# Patient Record
Sex: Female | Born: 1935 | Race: White | Hispanic: No | Smoking: Never smoker
Health system: Southern US, Community
[De-identification: ages and names within clinical notes are randomized; demographics above are authoritative.]

## PROBLEM LIST (undated history)

## (undated) DIAGNOSIS — C801 Malignant (primary) neoplasm, unspecified: Secondary | ICD-10-CM

---

## 2004-01-10 ENCOUNTER — Other Ambulatory Visit: Payer: Self-pay

## 2004-01-11 ENCOUNTER — Other Ambulatory Visit: Payer: Self-pay

## 2004-01-11 ENCOUNTER — Inpatient Hospital Stay: Payer: Self-pay | Admitting: Internal Medicine

## 2004-08-05 ENCOUNTER — Ambulatory Visit: Payer: Self-pay | Admitting: Internal Medicine

## 2005-06-15 ENCOUNTER — Ambulatory Visit: Payer: Self-pay | Admitting: Internal Medicine

## 2005-08-11 ENCOUNTER — Ambulatory Visit: Payer: Self-pay | Admitting: Internal Medicine

## 2005-10-10 ENCOUNTER — Emergency Department: Payer: Self-pay | Admitting: Emergency Medicine

## 2006-08-15 ENCOUNTER — Ambulatory Visit: Payer: Self-pay | Admitting: Internal Medicine

## 2007-02-20 ENCOUNTER — Ambulatory Visit: Payer: Self-pay | Admitting: Gastroenterology

## 2007-03-13 ENCOUNTER — Ambulatory Visit (HOSPITAL_COMMUNITY): Admission: RE | Admit: 2007-03-13 | Discharge: 2007-03-13 | Payer: Self-pay | Admitting: Gastroenterology

## 2007-03-13 ENCOUNTER — Ambulatory Visit: Payer: Self-pay | Admitting: Gastroenterology

## 2007-03-16 ENCOUNTER — Ambulatory Visit (HOSPITAL_COMMUNITY): Admission: RE | Admit: 2007-03-16 | Discharge: 2007-03-16 | Payer: Self-pay | Admitting: Gastroenterology

## 2007-08-17 ENCOUNTER — Ambulatory Visit: Payer: Self-pay | Admitting: Internal Medicine

## 2008-01-29 ENCOUNTER — Ambulatory Visit: Payer: Self-pay | Admitting: Internal Medicine

## 2008-08-19 ENCOUNTER — Ambulatory Visit: Payer: Self-pay | Admitting: Internal Medicine

## 2009-08-20 ENCOUNTER — Ambulatory Visit: Payer: Self-pay | Admitting: Internal Medicine

## 2009-09-30 ENCOUNTER — Emergency Department: Payer: Self-pay | Admitting: Otolaryngology

## 2010-07-14 NOTE — H&P (Signed)
Tammy Alvarado, Tammy Alvarado                ACCOUNT NO.:  [id number]   MEDICAL RECORD NO.:  [id number]          PATIENT TYPE:  AMB   LOCATION:  DAY                           FACILITY:  [facility]   PHYSICIAN:  Kassie Mends, M.D.      DATE OF BIRTH:  10/23/1931   DATE OF ADMISSION:  DATE OF DISCHARGE:  LH                              HISTORY & PHYSICAL   PRIMARY CARE PHYSICIAN:  Dr. Bethann Punches.   CHIEF COMPLAINT:  Chest pain/not in chest.   HISTORY OF PRESENT ILLNESS:  Tammy Alvarado is a 77 year old female. She has  had several episodes over many years of feeling as though she has a  significant pressure/pain in her chest with swallowing.  She had an  episode about 3 weeks ago where she was awakened in the middle of the  night with leg cramps. She got up to drink some tonic water, while  drinking the water she felt a severe knot and chest pressure.  She  grabbed onto the kitchen cabinet and felt like she was going to pass  out.  She became quite diaphoretic. She had significant weakness. She  said that the sensation passed after 5 minutes or so but she did not  feel herself she never sought medical care after this episode until  today.  She denies any heartburn or indigestion.  She has not had a  physical exam through her primary care physician, Dr. Bethann Punches, this  year. She occasionally complains of dysphagia when drinking liquids or  eating foods. She has had some burning-type pain to her epigastrium and  retrosternally with swallowing water as well.  The pain at night was  8/10 on a pain scale. She denies any rectal bleeding.  Denies any  melena.  Denies any diarrhea or constipation.  Denies any nausea or  vomiting.   PAST MEDICAL AND SURGICAL HISTORY:  Migraine headaches. She had a  colonoscopy 12 years ago which was normal per her report. She has had  cataract surgery in the left eye and right knee surgery.   CURRENT MEDICATIONS:  1. B12 250 mcg daily.  2. Biotin 1 mg daily.  3.  PreserVision once daily.  4. Calcium 600 mg of vitamin D daily.  5. Tylenol p.r.n.   ALLERGIES:  TETANUS shot and bee stings.   FAMILY HISTORY:  There is no known family history of colorectal  carcinoma or liver problems.  One brother did die of esophageal cancer  at age 24.  Mother deceased at age 35 secondary to old age.  Father  deceased at 64 unknown etiology.  Three sisters, one with leukemia, one  with breast cancer, diabetes mellitus,two brothers, one with  hypertension.   SOCIAL HISTORY:  Tammy Alvarado is a widow.  She lives alone.  She is retired.  She has three healthy sons.  She denies any tobacco, alcohol or drug  use.  She is a retired Insurance account manager.   REVIEW OF SYSTEMS:  See HPI.  CARDIOVASCULAR:  She denies any chest pain  currently and has not had any since the last  episode 3 weeks ago.  PULMONARY:  She denies any chronic cough.  She does have some shortness  of breath with the episodes of pain but then resolves. She denies any  dyspnea on exertion, denies any hemoptysis.  GI: See HPI. GU: Denies any  dysuria, hematuria, or increased urinary frequency. CONSTITUTIONAL:  Weight has been stable, denies any fever or chills.   PHYSICAL EXAM:  VITAL SIGNS:  Weight 182 pounds, height 64 inches,  temperature 97.5, blood pressure 158/92, pulse 60.  GENERAL:  She is an elderly Caucasian female who is alert, oriented,  pleasant, cooperative in no acute distress.  HEENT:  Sclera clear,  nonicteric,  conjunctiva clear, oropharynx pink  and moist without any lesions.  NECK:  Supple without mass or thyromegaly.  CHEST:  Heart regular rate and rhythm.  Normal S1 and S2 without  murmurs, clicks, rubs or gallops.  LUNGS:  Clear to auscultation bilaterally.  ABDOMEN:  Positive bowel sounds x4.  No bruits auscultated.  Soft,  nontender, nondistended.  No palpable mass or hepatosplenomegaly.  No  rebound tenderness or guarding.  EXTREMITIES:  Without clubbing or edema bilaterally.   SKIN:  Pink, warm and dry without any rash or jaundice.   IMPRESSION:  Tammy Alvarado is a 77 year old, self-referred patient with  episodic atypical chest pain which is mostly retrosternal initiated with  swallowing water usually.  Interestingly with episode she is having  presyncope and severe weakness and diaphoresis.  The pain can be quite  severe at times.  I am not 100% convinced that this is due to  gastroesophageal reflux disease or structural abnormality of the  esophagus, for example web ring or stricture.  At minimum, she is going  to need complete physical exam by Dr. Hyacinth Meeker to rule out cardiopulmonary  etiology given her other symptoms initially.  Her symptoms are quite  infrequent and intermittent.  She is also noted to be hypertensive today  with a blood pressure of 158/92.   PLAN:  1. Would like her to be seen by Dr. Bethann Punches, her primary care      physician, as soon as possible for further evaluation to rule out      cardiopulmonary causes of chest pain. Once she is cleared from him,      will proceed with EGD to further evaluate her esophagus and she is      due for screening colonoscopy which can be scheduled at the same      time with Dr. Cira Servant. I have discussed both procedures including      risks and benefits which include but are not limited to bleeding,      infection, perforation, and drug reaction and she agrees to plan      and consent will be obtained.  2. Further recommendations pending procedures.      Lorenza Burton, N.P.      Kassie Mends, M.D.  Electronically Signed    KJ/MEDQ  D:  02/20/2007  T:  02/21/2007  Job:  [id number]   cc:   Bethann Punches  Fax: [phone]

## 2010-07-14 NOTE — Op Note (Signed)
Tammy Alvarado, Tammy Alvarado                ACCOUNT NO.:  [id number]   MEDICAL RECORD NO.:  [id number]          PATIENT TYPE:  AMB   LOCATION:  DAY                           FACILITY:  [facility]   PHYSICIAN:  Kassie Mends, M.D.      DATE OF BIRTH:  1931-08-09   DATE OF PROCEDURE:  03/13/2007  DATE OF DISCHARGE:                               OPERATIVE REPORT   PROCEDURE:  1. Esophagogastroduodenoscopy with Savary dilation.  2. Incomplete colonoscopy.   FINDINGS:  1. Sigmoid colon diverticulosis.  Otherwise no polyps, masses,      inflammatory changes or arteriovenous malformations seen.  The      colonoscopy could not be complete because when the scope looped in      her sigmoid colon and cause distention, the patient's heart rate      decreased from 70 to 37.  The cecum could not be intubated without      causing cecal distention and the procedure was terminated.  2. Normal retroflexed view of the rectum.  3. Distal esophageal web dilated with a Savary dilator to 15 mm.      Otherwise no masses, erosions, or ulcerations seen in the      esophagus.  4. Normal stomach, duodenal bulb and second portion of the duodenum.   DIAGNOSES:  1. Dysphagia likely secondary to distal esophageal web.  2. Incomplete colonoscopy due to vagal response.   RECOMMENDATIONS:  1. Ms. Ohalloran should have a double contrast barium enema to complete      evaluation of her colon.  I do not recommend that she have a      colonoscopy because of her vagal response to scope looping in      insertion.  2. Follow-up appointment with Dr. Cira Servant in six  weeks to reevaluate      her dysphagia.  3. No aspirin, NSAIDs or anticoagulation for seven days.  4. She should follow high fiber diet.  She is given handout on high-      fiber diet, diverticulosis.   MEDICATIONS:  1. Demerol 75 mg IV.  2. Versed 6 mg IV.   PROCEDURE TECHNIQUE:  Physical exam was performed.  Informed consent was  obtained from the patient after explaining  benefits, risks and  alternatives to the procedure.  The patient was connected to monitor and  placed in left lateral position.  Continuous oxygen was provided by  nasal cannula and IV medicine administered through an indwelling  cannula.  After administration of sedation and rectal exam, the  patient's rectum intubated.  The scope was advanced under direct  visualization to the proximal transverse colon.  The scope was removed  slowly by carefully examining the color, texture, anatomy and integrity  of mucosa on the way out.   After the colonoscopy, the patient's esophagus was intubated with a  diagnostic gastroscope.  The scope was advanced under direct  visualization to the second portion of the duodenum.  The scope was  removed slowly by carefully examining the color, texture, anatomy and  integrity of the mucosa on the way out.  The scope was then advanced  from the proximal esophagus into the antrum.  The Savary guidewire was  introduced.  The scope was removed.  The Savary dilators were introduced  successively from 12.8 mm to 15 mm.  The patient was recovered in  endoscopy.  Due to her somnolence and borderline heart rate 0.4 mg a  Romazicon was administered IV.  The patient was discharged home in  satisfactory condition.      Kassie Mends, M.D.  Electronically Signed     SM/MEDQ  D:  03/13/2007  T:  03/13/2007  Job:  [id number]   cc:   Bethann Punches  Fax: [phone]

## 2010-10-15 ENCOUNTER — Ambulatory Visit: Payer: Self-pay | Admitting: Internal Medicine

## 2010-10-22 ENCOUNTER — Ambulatory Visit: Payer: Self-pay | Admitting: Physician Assistant

## 2010-10-23 ENCOUNTER — Other Ambulatory Visit: Payer: Self-pay | Admitting: Physician Assistant

## 2011-10-26 ENCOUNTER — Ambulatory Visit: Payer: Self-pay | Admitting: Internal Medicine

## 2012-05-01 ENCOUNTER — Observation Stay: Payer: Self-pay | Admitting: Internal Medicine

## 2012-05-01 LAB — CK TOTAL AND CKMB (NOT AT ARMC)
CK, Total: 55 U/L (ref 21–215)
CK-MB: 1.7 ng/mL (ref 0.5–3.6)

## 2012-05-01 LAB — COMPREHENSIVE METABOLIC PANEL
Albumin: 4.3 g/dL (ref 3.4–5.0)
Alkaline Phosphatase: 105 U/L (ref 50–136)
Calcium, Total: 9.7 mg/dL (ref 8.5–10.1)
Co2: 28 mmol/L (ref 21–32)
EGFR (African American): >60
EGFR (Non-African Amer.): 54 — ABNORMAL LOW
Glucose: 117 mg/dL — ABNORMAL HIGH (ref 65–99)
Osmolality: 286 (ref 275–301)
Potassium: 3.8 mmol/L (ref 3.5–5.1)
SGOT(AST): 18 U/L (ref 15–37)
Sodium: 140 mmol/L (ref 136–145)

## 2012-05-01 LAB — CBC
MCH: 30.8 pg (ref 26.0–34.0)
RBC: 5.11 10*6/uL (ref 3.80–5.20)
RDW: 13.7 % (ref 11.5–14.5)

## 2012-05-01 LAB — TROPONIN I: Troponin-I: <0.02 ng/mL

## 2012-05-02 DIAGNOSIS — R079 Chest pain, unspecified: Secondary | ICD-10-CM

## 2012-05-02 LAB — CK TOTAL AND CKMB (NOT AT ARMC): CK, Total: 51 U/L (ref 21–215)

## 2012-05-16 ENCOUNTER — Ambulatory Visit: Payer: Self-pay | Admitting: Internal Medicine

## 2012-05-24 ENCOUNTER — Ambulatory Visit: Payer: Self-pay | Admitting: Gastroenterology

## 2012-05-25 LAB — PATHOLOGY REPORT

## 2012-12-25 ENCOUNTER — Ambulatory Visit: Payer: Self-pay | Admitting: Internal Medicine

## 2012-12-28 ENCOUNTER — Emergency Department: Payer: Self-pay | Admitting: Emergency Medicine

## 2012-12-28 LAB — CBC
HCT: 42.6 % (ref 35.0–47.0)
MCH: 31.2 pg (ref 26.0–34.0)
MCHC: 34.7 g/dL (ref 32.0–36.0)
Platelet: 185 10*3/uL (ref 150–440)
RBC: 4.74 10*6/uL (ref 3.80–5.20)
RDW: 13.7 % (ref 11.5–14.5)
WBC: 8.6 10*3/uL (ref 3.6–11.0)

## 2012-12-28 LAB — CK TOTAL AND CKMB (NOT AT ARMC)
CK, Total: 158 U/L (ref 21–215)
CK-MB: 3.3 ng/mL (ref 0.5–3.6)

## 2012-12-28 LAB — TROPONIN I: Troponin-I: <0.02 ng/mL

## 2012-12-28 LAB — BASIC METABOLIC PANEL
Anion Gap: 6 — ABNORMAL LOW (ref 7–16)
Co2: 26 mmol/L (ref 21–32)
Osmolality: 287 (ref 275–301)

## 2012-12-28 LAB — PROTIME-INR
INR: 1
Prothrombin Time: 12.9 secs (ref 11.5–14.7)

## 2013-03-26 ENCOUNTER — Ambulatory Visit: Payer: Self-pay | Admitting: Internal Medicine

## 2013-07-02 ENCOUNTER — Ambulatory Visit: Payer: Self-pay | Admitting: Ophthalmology

## 2013-07-11 ENCOUNTER — Ambulatory Visit: Payer: Self-pay | Admitting: Ophthalmology

## 2013-11-14 ENCOUNTER — Emergency Department: Payer: Self-pay | Admitting: Emergency Medicine

## 2013-11-14 LAB — COMPREHENSIVE METABOLIC PANEL
ALK PHOS: 89 U/L
ALT: 16 U/L
AST: 29 U/L (ref 15–37)
Albumin: 3.8 g/dL (ref 3.4–5.0)
Anion Gap: 5 — ABNORMAL LOW (ref 7–16)
BUN: 22 mg/dL — ABNORMAL HIGH (ref 7–18)
Bilirubin,Total: 0.5 mg/dL (ref 0.2–1.0)
CALCIUM: 8.7 mg/dL (ref 8.5–10.1)
CHLORIDE: 109 mmol/L — AB (ref 98–107)
Co2: 27 mmol/L (ref 21–32)
Creatinine: 0.63 mg/dL (ref 0.60–1.30)
EGFR (African American): >60
EGFR (Non-African Amer.): >60
Glucose: 86 mg/dL (ref 65–99)
OSMOLALITY: 284 (ref 275–301)
POTASSIUM: 4 mmol/L (ref 3.5–5.1)
SODIUM: 141 mmol/L (ref 136–145)
Total Protein: 7 g/dL (ref 6.4–8.2)

## 2013-11-14 LAB — CBC
HCT: 45.9 % (ref 35.0–47.0)
HGB: 15.1 g/dL (ref 12.0–16.0)
MCH: 30.5 pg (ref 26.0–34.0)
MCHC: 33 g/dL (ref 32.0–36.0)
MCV: 93 fL (ref 80–100)
PLATELETS: 213 10*3/uL (ref 150–440)
RBC: 4.96 10*6/uL (ref 3.80–5.20)
RDW: 13.7 % (ref 11.5–14.5)
WBC: 7.1 10*3/uL (ref 3.6–11.0)

## 2013-11-14 LAB — URINALYSIS, COMPLETE
BILIRUBIN, UR: NEGATIVE
BLOOD: NEGATIVE
Glucose,UR: NEGATIVE mg/dL (ref 0–75)
Hyaline Cast: 3
KETONE: NEGATIVE
LEUKOCYTE ESTERASE: NEGATIVE
NITRITE: POSITIVE
PH: 5 (ref 4.5–8.0)
PROTEIN: NEGATIVE
RBC,UR: 2 /HPF (ref 0–5)
Specific Gravity: 1.025 (ref 1.003–1.030)
Squamous Epithelial: 1
WBC UR: 7 /HPF (ref 0–5)

## 2013-11-14 LAB — TROPONIN I: Troponin-I: <0.02 ng/mL

## 2013-11-16 LAB — URINE CULTURE

## 2013-12-03 DIAGNOSIS — M5030 Other cervical disc degeneration, unspecified cervical region: Secondary | ICD-10-CM | POA: Insufficient documentation

## 2013-12-26 ENCOUNTER — Ambulatory Visit: Payer: Self-pay | Admitting: Internal Medicine

## 2014-05-16 DIAGNOSIS — K625 Hemorrhage of anus and rectum: Secondary | ICD-10-CM | POA: Diagnosis not present

## 2014-05-16 DIAGNOSIS — M1990 Unspecified osteoarthritis, unspecified site: Secondary | ICD-10-CM | POA: Diagnosis not present

## 2014-05-16 DIAGNOSIS — Z79899 Other long term (current) drug therapy: Secondary | ICD-10-CM | POA: Diagnosis not present

## 2014-05-16 DIAGNOSIS — K219 Gastro-esophageal reflux disease without esophagitis: Secondary | ICD-10-CM | POA: Diagnosis not present

## 2014-06-21 NOTE — Discharge Summary (Signed)
PATIENT NAME:  Tammy Alvarado, Tammy Alvarado MR#:  [id number] DATE OF BIRTH:  [birth year]  DATE OF ADMISSION:  05/01/2012 DATE OF DISCHARGE:  05/02/2012  DISCHARGE DIAGNOSES: 1.  Hypotension with atypical chest pain.  2.  Viral gastroenteritis.  3.  Acid reflux.   DISCHARGE MEDICATIONS:  Ranitidine 150 mg b.i.d., amlodipine 5 mg at bedtime.   REASON FOR ADMISSION:  This is a 81 year old female who presents with hypotension, diarrhea and chest pain. Please see history and physical for history of present illness, past medical history, and physical exam.   HOSPITAL COURSE: The patient was admitted, ruled out for myocardial infarction. EKG normal.  Stress Myoview was normal.  Her diarrhea was improved. Her blood pressure medicine of lisinopril was discontinued and Norvasc added at bedtime. She will follow up with Dr. Sabra Heck on the following day to follow up on her bowel symptoms.   Overall prognosis: Good     ____________________________ Rusty Aus, MD mfm:ct D: 05/03/2012 17:46:23 ET T: 05/03/2012 19:22:49 ET JOB#: [id number]  cc: Rusty Aus, MD, <Dictator> MARK Roselee Culver MD ELECTRONICALLY SIGNED 05/05/2012 10:53

## 2014-06-21 NOTE — H&P (Signed)
PATIENT NAME:  Tammy Alvarado, Tammy Alvarado MR#:  [id number] DATE OF BIRTH:  1931/10/31  DATE OF ADMISSION:  05/01/2012  PRIMARY CARE PHYSICIAN: Dr. Emily Filbert.   CHIEF COMPLAINT: Chest pain.   HISTORY OF PRESENT ILLNESS: A 81 year old female patient with history of hypertension and a negative stress test in 2005, presents to the Emergency Room with acute onset of midsternal heaviness. This was associated with nausea with nonradiating 8/10 presently down to 3/10 with no response to nitroglycerin pills. The patient has not had any shortness of breath or palpitations. She had some chest burning in 2005, for which she had a stress test, but this pain is different from that.   The patient has been recently started on 2, blood pressure pills by Dr. Sabra Heck, but she does not remember their names.   PAST MEDICAL HISTORY: Hypertension, GERD.   PAST SURGICAL HISTORY: Knee surgery, nose skin cancer surgery.   SOCIAL HISTORY: The patient does not smoke. No alcohol. No illicit drugs. 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   FAMILY HISTORY: Father had an enlarged heart.   HOME MEDICATIONS: Unknown, but the patient mentions that she is on 2 blood pressure medications.   ALLERGIES: CEFTIN, TETANUS.   REVIEW OF SYSTEMS:  CONSTITUTIONAL: No fatigue, weakness.  EYES: No blurred vision, pain, redness.   ENT: No tinnitus, ear pain, hearing loss.  RESPIRATORY: No cough, wheeze, hemoptysis.  CARDIOVASCULAR: Complains of midsternal heaviness. No arrhythmia.  GASTROINTESTINAL: Had nausea, but no vomiting. Had diarrhea week prior for a day, which has resolved.  GENITOURINARY: No dysuria, hematuria, frequency.  ENDOCRINE: No polyuria, nocturia, thyroid problems.  HEMATOLOGIC/LYMPHATIC: No anemia, easy bruising, bleeding.  INTEGUMENTARY: No acne, rash, lesions.  MUSCULOSKELETAL: No back pain or arthritis.  NEUROLOGIC: No focal numbness, weakness, seizures.  PSYCHIATRIC:  No anxiety,  depression.   PHYSICAL EXAMINATION: VITAL SIGNS: Temperature 98.3, pulse 87, blood pressure 168/92, saturating 96% on room air.  GENERAL: Obese, Caucasian female patient lying in bed, comfortable, conversational, cooperative with exam.  PSYCHIATRIC:  Alert and oriented x 3. Mood and affect appropriate. Judgment intact.  HEENT: Atraumatic, normocephalic. Oral mucosa moist and pink. External ears and nose normal. No pallor. No icterus. Pupils bilaterally equal and reactive to light.  NECK: Supple. No thyromegaly. No palpable lymph nodes. Trachea midline. No carotid bruit, JVD.  CARDIOVASCULAR: S1, S2 without any murmurs, regular rate and rhythm. No edema.  RESPIRATORY: Normal work of breathing. Clear to auscultation on both sides. No chest wall tenderness.  GASTROINTESTINAL: Soft abdomen, nontender. Bowel sounds present. No hepatosplenomegaly palpable.  SKIN: Warm and dry. No petechiae, rash, ulcers.  MUSCULOSKELETAL: No joint swelling, redness, effusion of the large joints. Normal muscle tone.  NEUROLOGICAL: Motor strength 5/5 in upper and lower extremities. Sensation to fine touch intact all over.  LYMPHATIC: No cervical lymphadenopathy.   LABORATORY, DIAGNOSTIC AND RADIOLOGIC:  Show glucose 117, BUN 28, creatinine 1. Troponin less than 0.02. AST, ALT, alkaline phosphatase, bilirubin normal. INR of 1.   EKG shows normal sinus rhythm with no acute ST-T wave changes.   Chest x-ray PA and lateral shows no acute cardiopulmonary disease.   ASSESSMENT AND PLAN: 1.  Atypical chest pain in a 81 year old patient with hypertension. The patient did have acute onset of chest pain, which she describes as heaviness associated with nausea, but has not had any shortness of breath, palpitations, or lightheadedness or radiation to the left arm, presently atypical. We will check 2 more sets of cardiac enzymes.  If normal, the patient will have a Lexiscan stress test in the morning. If abnormal, the patient will  need cardiology consult and possible catheterization.  We will start the patient on aspirin, nitroglycerin p.r.n. The patient will be on a telemetry floor.  2.  Hypertension, uncontrolled. This could be secondary to the chest pain and anxiety. She does not remember her home medications. We will start her on hydrochlorothiazide, use IV pain medications to control the blood pressure,  needs to restart home medications once available.  3.  Gastroesophageal reflux disease. The patient's chest pain could be a variant of her acid reflux. We will start her on Protonix while in the hospital.  4.  Deep vein thrombosis prophylaxis with Lovenox.   CODE STATUS: Full code.   TIME SPENT: Today on this case was 40 minutes.     ____________________________ Leia Alf Sudini, MD srs:cc D: 05/01/2012 17:13:11 ET T: 05/01/2012 18:42:08 ET JOB#: [id number]  cc: Alveta Heimlich R. Darvin Neighbours, MD, <Dictator> Rusty Aus, MD Neita Carp MD ELECTRONICALLY SIGNED 05/02/2012 14:03

## 2014-06-22 NOTE — Op Note (Signed)
PATIENT NAME:  Tammy Alvarado, Tammy Alvarado MR#:  [id number] DATE OF BIRTH:  11-12-1931  DATE OF PROCEDURE:  07/11/2013  PREOPERATIVE DIAGNOSIS:  Senile cataract right eye.  POSTOPERATIVE DIAGNOSIS:  Senile cataract right eye.  PROCEDURE:  Phacoemulsification with posterior chamber intraocular lens implantation of the right eye.  LENS: ZCB00 22.5-diopter posterior chamber intraocular lens.  ULTRASOUND TIME:  26 % of 1 minute, 14 seconds.  CDE 19.1.   SURGEON:  Mali Eric Nees, MD  ANESTHESIA:  Topical with tetracaine drops and 2% Xylocaine jelly.  COMPLICATIONS:  None.  DESCRIPTION OF PROCEDURE:  The patient was identified in the holding room and transported to the operating room and placed in the supine position under the operating microscope.  The right eye was identified as the operative eye and it was prepped and draped in the usual sterile ophthalmic fashion.  A 1 millimeter clear-corneal paracentesis was made at the 12 o'clock position.  The anterior chamber was filled with Viscoat viscoelastic.  A 2.4 millimeter keratome was used to make a near-clear corneal incision at the 9 o'clock position.  A curvilinear capsulorrhexis was made with a cystotome and capsulorrhexis forceps.  Balanced salt solution was used to hydrodissect and hydrodelineate the nucleus.  Phacoemulsification was then used in stop and chop fashion to remove the lens nucleus and epinucleus.  The remaining cortex was then removed using the irrigation and aspiration handpiece. Provisc was then placed into the capsular bag to distend it for lens placement.  A ZCB00 22.5-diopter lens was then injected into the capsular bag.  The remaining viscoelastic was aspirated.  Wounds were hydrated with balanced salt solution.  The anterior chamber was inflated to a physiologic pressure with balanced salt solution.  A 0.1 mL of Vigamox 1 mg/mL was injected into the anterior chamber for a total dose of 0.1 mg of Vigamox antibiotic at the completion  of the case. Miostat was placed into the anterior chamber to constrict the pupil.  No wound leaks were noted.  Topical Vigamox drops and Maxitrol ointment were applied to the eye.  The patient was taken to the recovery room in stable condition without complications of anesthesia or surgery.   ____________________________ Wyonia Hough, MD crb:aw D: 07/11/2013 13:40:07 ET T: 07/11/2013 14:12:54 ET JOB#: [id number]  cc: Wyonia Hough, MD, <Dictator> Leandrew Koyanagi MD ELECTRONICALLY SIGNED 07/18/2013 12:39

## 2014-07-03 DIAGNOSIS — M519 Unspecified thoracic, thoracolumbar and lumbosacral intervertebral disc disorder: Secondary | ICD-10-CM | POA: Diagnosis not present

## 2014-07-03 DIAGNOSIS — M7061 Trochanteric bursitis, right hip: Secondary | ICD-10-CM | POA: Diagnosis not present

## 2014-07-03 DIAGNOSIS — M7062 Trochanteric bursitis, left hip: Secondary | ICD-10-CM | POA: Diagnosis not present

## 2014-09-16 DIAGNOSIS — K219 Gastro-esophageal reflux disease without esophagitis: Secondary | ICD-10-CM | POA: Diagnosis not present

## 2014-09-16 DIAGNOSIS — M1990 Unspecified osteoarthritis, unspecified site: Secondary | ICD-10-CM | POA: Diagnosis not present

## 2014-09-16 DIAGNOSIS — Z79899 Other long term (current) drug therapy: Secondary | ICD-10-CM | POA: Diagnosis not present

## 2014-09-24 ENCOUNTER — Other Ambulatory Visit: Payer: Self-pay | Admitting: Internal Medicine

## 2014-09-24 DIAGNOSIS — Z1231 Encounter for screening mammogram for malignant neoplasm of breast: Secondary | ICD-10-CM

## 2014-09-24 DIAGNOSIS — M519 Unspecified thoracic, thoracolumbar and lumbosacral intervertebral disc disorder: Secondary | ICD-10-CM | POA: Diagnosis not present

## 2014-09-24 DIAGNOSIS — M769 Unspecified enthesopathy, lower limb, excluding foot: Secondary | ICD-10-CM | POA: Diagnosis not present

## 2014-09-24 DIAGNOSIS — Z0000 Encounter for general adult medical examination without abnormal findings: Secondary | ICD-10-CM | POA: Diagnosis not present

## 2014-12-30 ENCOUNTER — Ambulatory Visit: Payer: PRIVATE HEALTH INSURANCE

## 2015-01-17 ENCOUNTER — Ambulatory Visit
Admission: RE | Admit: 2015-01-17 | Discharge: 2015-01-17 | Disposition: A | Discharge status: Home / Self Care | Payer: Commercial Managed Care - HMO | Source: Ambulatory Visit | Attending: Internal Medicine | Admitting: Internal Medicine

## 2015-01-17 ENCOUNTER — Other Ambulatory Visit: Payer: Self-pay | Admitting: Internal Medicine

## 2015-01-17 DIAGNOSIS — Z1231 Encounter for screening mammogram for malignant neoplasm of breast: Secondary | ICD-10-CM | POA: Diagnosis not present

## 2015-01-20 DIAGNOSIS — Z23 Encounter for immunization: Secondary | ICD-10-CM | POA: Diagnosis not present

## 2015-01-20 DIAGNOSIS — M769 Unspecified enthesopathy, lower limb, excluding foot: Secondary | ICD-10-CM | POA: Diagnosis not present

## 2015-09-25 DIAGNOSIS — M170 Bilateral primary osteoarthritis of knee: Secondary | ICD-10-CM | POA: Insufficient documentation

## 2015-12-17 ENCOUNTER — Other Ambulatory Visit: Payer: Self-pay | Admitting: Internal Medicine

## 2015-12-17 DIAGNOSIS — Z1231 Encounter for screening mammogram for malignant neoplasm of breast: Secondary | ICD-10-CM

## 2016-01-21 ENCOUNTER — Ambulatory Visit
Admission: RE | Admit: 2016-01-21 | Discharge: 2016-01-21 | Disposition: A | Discharge status: Home / Self Care | Payer: PPO | Source: Ambulatory Visit | Attending: Internal Medicine | Admitting: Internal Medicine

## 2016-01-21 DIAGNOSIS — Z1231 Encounter for screening mammogram for malignant neoplasm of breast: Secondary | ICD-10-CM

## 2016-03-29 DIAGNOSIS — I10 Essential (primary) hypertension: Secondary | ICD-10-CM | POA: Insufficient documentation

## 2016-04-12 DIAGNOSIS — Z0000 Encounter for general adult medical examination without abnormal findings: Secondary | ICD-10-CM | POA: Insufficient documentation

## 2016-12-10 ENCOUNTER — Other Ambulatory Visit: Payer: Self-pay | Admitting: Internal Medicine

## 2016-12-10 DIAGNOSIS — Z1231 Encounter for screening mammogram for malignant neoplasm of breast: Secondary | ICD-10-CM

## 2017-01-25 ENCOUNTER — Ambulatory Visit
Admission: RE | Admit: 2017-01-25 | Discharge: 2017-01-25 | Disposition: A | Discharge status: Home / Self Care | Payer: Medicare HMO | Source: Ambulatory Visit | Attending: Internal Medicine | Admitting: Internal Medicine

## 2017-01-25 DIAGNOSIS — Z1231 Encounter for screening mammogram for malignant neoplasm of breast: Secondary | ICD-10-CM | POA: Insufficient documentation

## 2017-01-25 HISTORY — DX: Malignant (primary) neoplasm, unspecified: C80.1

## 2017-11-07 DIAGNOSIS — M48061 Spinal stenosis, lumbar region without neurogenic claudication: Secondary | ICD-10-CM | POA: Insufficient documentation

## 2017-11-08 ENCOUNTER — Other Ambulatory Visit: Payer: Self-pay | Admitting: Internal Medicine

## 2017-11-08 DIAGNOSIS — M5440 Lumbago with sciatica, unspecified side: Secondary | ICD-10-CM

## 2017-11-14 ENCOUNTER — Ambulatory Visit: Payer: Medicare HMO

## 2018-01-04 ENCOUNTER — Other Ambulatory Visit: Payer: Self-pay | Admitting: Internal Medicine

## 2018-01-04 DIAGNOSIS — Z1231 Encounter for screening mammogram for malignant neoplasm of breast: Secondary | ICD-10-CM

## 2018-02-02 ENCOUNTER — Ambulatory Visit
Admission: RE | Admit: 2018-02-02 | Discharge: 2018-02-02 | Disposition: A | Discharge status: Home / Self Care | Payer: Medicare HMO | Source: Ambulatory Visit | Attending: Internal Medicine | Admitting: Internal Medicine

## 2018-02-02 DIAGNOSIS — Z1231 Encounter for screening mammogram for malignant neoplasm of breast: Secondary | ICD-10-CM | POA: Diagnosis not present

## 2018-12-25 ENCOUNTER — Other Ambulatory Visit: Payer: Self-pay | Admitting: Internal Medicine

## 2018-12-25 DIAGNOSIS — Z1231 Encounter for screening mammogram for malignant neoplasm of breast: Secondary | ICD-10-CM

## 2019-01-19 ENCOUNTER — Other Ambulatory Visit: Payer: Self-pay | Admitting: Gastroenterology

## 2019-01-19 DIAGNOSIS — R634 Abnormal weight loss: Secondary | ICD-10-CM

## 2019-01-19 DIAGNOSIS — R197 Diarrhea, unspecified: Secondary | ICD-10-CM

## 2019-02-02 ENCOUNTER — Ambulatory Visit: Payer: Medicare Other

## 2019-02-05 ENCOUNTER — Ambulatory Visit
Admission: RE | Admit: 2019-02-05 | Discharge: 2019-02-05 | Disposition: A | Discharge status: Home / Self Care | Payer: Medicare Other | Source: Ambulatory Visit | Attending: Internal Medicine | Admitting: Internal Medicine

## 2019-02-05 DIAGNOSIS — Z1231 Encounter for screening mammogram for malignant neoplasm of breast: Secondary | ICD-10-CM | POA: Insufficient documentation

## 2019-09-19 DIAGNOSIS — I739 Peripheral vascular disease, unspecified: Secondary | ICD-10-CM | POA: Insufficient documentation

## 2019-09-24 ENCOUNTER — Other Ambulatory Visit: Payer: Self-pay

## 2019-09-24 ENCOUNTER — Ambulatory Visit (INDEPENDENT_AMBULATORY_CARE_PROVIDER_SITE_OTHER): Payer: Medicare Other

## 2019-09-24 ENCOUNTER — Other Ambulatory Visit (INDEPENDENT_AMBULATORY_CARE_PROVIDER_SITE_OTHER): Payer: Self-pay | Admitting: Nurse Practitioner

## 2019-09-24 ENCOUNTER — Ambulatory Visit (INDEPENDENT_AMBULATORY_CARE_PROVIDER_SITE_OTHER): Payer: Medicare Other | Admitting: Nurse Practitioner

## 2019-09-24 ENCOUNTER — Other Ambulatory Visit (INDEPENDENT_AMBULATORY_CARE_PROVIDER_SITE_OTHER): Payer: Self-pay | Admitting: Urology

## 2019-09-24 ENCOUNTER — Encounter (INDEPENDENT_AMBULATORY_CARE_PROVIDER_SITE_OTHER): Payer: Self-pay | Admitting: Nurse Practitioner

## 2019-09-24 VITALS — BP 129/70 | HR 58 | Resp 14 | Ht 63.0 in | Wt 140.0 lb

## 2019-09-24 DIAGNOSIS — M170 Bilateral primary osteoarthritis of knee: Secondary | ICD-10-CM | POA: Diagnosis not present

## 2019-09-24 DIAGNOSIS — I998 Other disorder of circulatory system: Secondary | ICD-10-CM

## 2019-09-24 DIAGNOSIS — R079 Chest pain, unspecified: Secondary | ICD-10-CM | POA: Insufficient documentation

## 2019-09-24 DIAGNOSIS — I739 Peripheral vascular disease, unspecified: Secondary | ICD-10-CM | POA: Diagnosis not present

## 2019-09-24 DIAGNOSIS — I10 Essential (primary) hypertension: Secondary | ICD-10-CM | POA: Diagnosis not present

## 2019-09-24 DIAGNOSIS — K222 Esophageal obstruction: Secondary | ICD-10-CM | POA: Insufficient documentation

## 2019-09-24 NOTE — Progress Notes (Signed)
Subjective:    Patient ID: Tammy Alvarado, female    DOB: 10-Sep-1931, 86 y.o.   MRN: [id number] Chief Complaint  Patient presents with  . New Patient (Initial Visit)    Ischemic leg    Tammy Alvarado is an 86 year old female that presents today for evaluation of possible ischemia of her left lower extremity.  The patient is asked to be seen by her primary care physician who is concerned that sudden color changes may be ischemic.  The patient notes that around Wednesday or so the patient had what felt like a blister on the bottom of her foot but it was noticed by her family that her foot and toe was bluish in coloration.  She notes that when she saw Dr. Sabra Heck her left great toe was discolored as well as her small toe and underneath her toes.  She denies any pain ever.  She denies any fever, chills, nausea, vomiting or diarrhea.  She denies any claudication-like symptoms.  She denies any rest pain like symptoms.  Patient does have a history of neuropathy.  Today noninvasive studies show an ABI of 1.11 on the right as well as 1.11 on the left.  The right has a TBI of 1.05 with a left of 0.75.  The patient has triphasic tibial artery waveforms in the right lower extremity with biphasic/triphasic in the left lower extremity.  The patient has good toe waveforms bilaterally.   Review of Systems  Cardiovascular: Positive for leg swelling.  Skin: Positive for color change.  Neurological: Positive for weakness.  Hematological: Bruises/bleeds easily.  All other systems reviewed and are negative.      Objective:   Physical Exam Vitals reviewed.  HENT:     Head: Normocephalic.  Cardiovascular:     Rate and Rhythm: Normal rate.     Pulses:          Dorsalis pedis pulses are 1+ on the right side and 1+ on the left side.       Posterior tibial pulses are 1+ on the right side and 1+ on the left side.     Heart sounds: Normal heart sounds.  Pulmonary:     Effort: Pulmonary effort is normal.      Breath sounds: Normal breath sounds.  Musculoskeletal:     Cervical back: Normal range of motion.     Right lower leg: Edema present.     Left lower leg: Edema present.  Skin:    General: Skin is dry.     Capillary Refill: Capillary refill takes 2 to 3 seconds.  Neurological:     Mental Status: She is alert and oriented to person, place, and time.     Motor: Weakness present.  Psychiatric:        Mood and Affect: Mood normal.        Behavior: Behavior normal.        Thought Content: Thought content normal.        Judgment: Judgment normal.     BP (!) 129/70 (BP Location: Right Arm)   Pulse 58   Resp 14   Ht 5\' 3"  (1.6 m)   Wt 140 lb (63.5 kg)   BMI 24.80 kg/m   Past Medical History:  Diagnosis Date  . Cancer ([place])    skin    Social History   Socioeconomic History  . Marital status: Widowed    Spouse name: Not on file  . Number of children: Not on file  .  Years of education: Not on file  . Highest education level: Not on file  Occupational History  . Not on file  Tobacco Use  . Smoking status: Never Smoker  . Smokeless tobacco: Never Used  Substance and Sexual Activity  . Alcohol use: Not on file  . Drug use: Not on file  . Sexual activity: Not on file  Other Topics Concern  . Not on file  Social History Narrative  . Not on file   Social Determinants of Health   Financial Resource Strain:   . Difficulty of Paying Living Expenses:   Food Insecurity:   . Worried About Charity fundraiser in the Last Year:   . Arboriculturist in the Last Year:   Transportation Needs:   . Film/video editor (Medical):   Marland Kitchen Lack of Transportation (Non-Medical):   Physical Activity:   . Days of Exercise per Week:   . Minutes of Exercise per Session:   Stress:   . Feeling of Stress :   Social Connections:   . Frequency of Communication with Friends and Family:   . Frequency of Social Gatherings with Friends and Family:   . Attends Religious Services:   . Active  Member of Clubs or Organizations:   . Attends Archivist Meetings:   Marland Kitchen Marital Status:   Intimate Partner Violence:   . Fear of Current or Ex-Partner:   . Emotionally Abused:   Marland Kitchen Physically Abused:   . Sexually Abused:     History reviewed. No pertinent surgical history.  Family History  Problem Relation Age of Onset  . Breast cancer Sister 74  . Breast cancer Maternal Grandmother     Allergies  Allergen Reactions  . Ceftin [Cefuroxime] Nausea Only  . Tramadol Other (See Comments)  . Doxycycline Rash  . Lisinopril Rash       Assessment & Plan:   1. PAD (peripheral artery disease) (HCC) the patient's noninvasive studies suggest that there is not evidence of ischemic peripheral artery disease.  There is no evidence of any immediate limb threatening symptoms.  The patient likely has some chronic venous insufficiency that is causing discoloration of her lower extremities.  There may also be a component of microvascular disease as well, which is generally not amenable to endovascular intervention.  The patient is advised to continue with a daily 81 mg aspirin.  The patient will follow up with [place] on an as-needed basis.  2. Benign essential hypertension Continue antihypertensive medications as already ordered, these medications have been reviewed and there are no changes at this time.   3. Osteoarthritis of both knees, unspecified osteoarthritis type Continue NSAID medications as already ordered, these medications have been reviewed and there are no changes at this time.  Continued activity and therapy was stressed.    Current Outpatient Medications on File Prior to Visit  Medication Sig Dispense Refill  . acetaminophen (TYLENOL) 325 MG tablet Take 650 mg by mouth every 6 (six) hours as needed.    . Calcium Carbonate-Vitamin D 600-200 MG-UNIT TABS Take 1 tablet by mouth daily.    Marland Kitchen dicyclomine (BENTYL) 20 MG tablet Take 20 mg by mouth 2 (two) times daily.    .  dorzolamide-timolol (COSOPT) 22.3-6.8 MG/ML ophthalmic solution Place 1 drop into the left eye 2 (two) times daily.    Marland Kitchen gabapentin (NEURONTIN) 100 MG capsule gabapentin 100 mg capsule    . latanoprost (XALATAN) 0.005 % ophthalmic solution     .  Menthol (BIOFREEZE) 10 % AERO Apply topically.    . Menthol, Topical Analgesic, (BIOFREEZE COLORLESS) 4 % GEL Apply topically.    . Menthol, Topical Analgesic, (BIOFREEZE EX) Apply topically.    . metoprolol tartrate (LOPRESSOR) 25 MG tablet Take by mouth.     No current facility-administered medications on file prior to visit.    There are no Patient Instructions on file for this visit. No follow-ups on file.   Kris Hartmann, NP

## 2020-01-03 ENCOUNTER — Other Ambulatory Visit: Payer: Self-pay | Admitting: Internal Medicine

## 2020-01-03 DIAGNOSIS — Z1231 Encounter for screening mammogram for malignant neoplasm of breast: Secondary | ICD-10-CM

## 2020-02-12 ENCOUNTER — Other Ambulatory Visit: Payer: Self-pay

## 2020-02-12 ENCOUNTER — Ambulatory Visit
Admission: RE | Admit: 2020-02-12 | Discharge: 2020-02-12 | Disposition: A | Discharge status: Home / Self Care | Payer: Medicare Other | Source: Ambulatory Visit | Attending: Internal Medicine | Admitting: Internal Medicine

## 2020-02-12 DIAGNOSIS — Z1231 Encounter for screening mammogram for malignant neoplasm of breast: Secondary | ICD-10-CM | POA: Diagnosis not present

## 2020-06-02 DIAGNOSIS — I739 Peripheral vascular disease, unspecified: Secondary | ICD-10-CM | POA: Diagnosis not present

## 2020-06-09 DIAGNOSIS — M4800 Spinal stenosis, site unspecified: Secondary | ICD-10-CM | POA: Diagnosis not present

## 2020-06-09 DIAGNOSIS — Z0000 Encounter for general adult medical examination without abnormal findings: Secondary | ICD-10-CM | POA: Diagnosis not present

## 2020-06-09 DIAGNOSIS — I10 Essential (primary) hypertension: Secondary | ICD-10-CM | POA: Diagnosis not present

## 2020-06-09 DIAGNOSIS — Z79899 Other long term (current) drug therapy: Secondary | ICD-10-CM | POA: Diagnosis not present

## 2020-12-02 DIAGNOSIS — Z79899 Other long term (current) drug therapy: Secondary | ICD-10-CM | POA: Diagnosis not present

## 2020-12-09 DIAGNOSIS — I10 Essential (primary) hypertension: Secondary | ICD-10-CM | POA: Diagnosis not present

## 2020-12-09 DIAGNOSIS — Z0000 Encounter for general adult medical examination without abnormal findings: Secondary | ICD-10-CM | POA: Diagnosis not present

## 2020-12-09 DIAGNOSIS — Z23 Encounter for immunization: Secondary | ICD-10-CM | POA: Diagnosis not present

## 2020-12-29 DIAGNOSIS — I10 Essential (primary) hypertension: Secondary | ICD-10-CM | POA: Diagnosis not present

## 2020-12-29 DIAGNOSIS — B372 Candidiasis of skin and nail: Secondary | ICD-10-CM | POA: Diagnosis not present

## 2021-01-06 ENCOUNTER — Other Ambulatory Visit: Payer: Self-pay | Admitting: Internal Medicine

## 2021-01-06 DIAGNOSIS — Z1231 Encounter for screening mammogram for malignant neoplasm of breast: Secondary | ICD-10-CM

## 2021-01-28 DIAGNOSIS — Y92002 Bathroom of unspecified non-institutional (private) residence single-family (private) house as the place of occurrence of the external cause: Secondary | ICD-10-CM | POA: Diagnosis not present

## 2021-01-28 DIAGNOSIS — S52511A Displaced fracture of right radial styloid process, initial encounter for closed fracture: Secondary | ICD-10-CM | POA: Diagnosis not present

## 2021-01-28 DIAGNOSIS — K5900 Constipation, unspecified: Secondary | ICD-10-CM | POA: Diagnosis not present

## 2021-01-28 DIAGNOSIS — W010XXA Fall on same level from slipping, tripping and stumbling without subsequent striking against object, initial encounter: Secondary | ICD-10-CM | POA: Diagnosis not present

## 2021-01-28 DIAGNOSIS — R55 Syncope and collapse: Secondary | ICD-10-CM | POA: Diagnosis not present

## 2021-01-28 DIAGNOSIS — M25531 Pain in right wrist: Secondary | ICD-10-CM | POA: Diagnosis not present

## 2021-02-06 DIAGNOSIS — M19031 Primary osteoarthritis, right wrist: Secondary | ICD-10-CM | POA: Diagnosis not present

## 2021-02-06 DIAGNOSIS — S52511A Displaced fracture of right radial styloid process, initial encounter for closed fracture: Secondary | ICD-10-CM | POA: Diagnosis not present

## 2021-02-06 DIAGNOSIS — M1811 Unilateral primary osteoarthritis of first carpometacarpal joint, right hand: Secondary | ICD-10-CM | POA: Diagnosis not present

## 2021-02-13 DIAGNOSIS — J069 Acute upper respiratory infection, unspecified: Secondary | ICD-10-CM | POA: Diagnosis not present

## 2021-02-13 DIAGNOSIS — R059 Cough, unspecified: Secondary | ICD-10-CM | POA: Diagnosis not present

## 2021-04-23 ENCOUNTER — Ambulatory Visit
Admission: RE | Admit: 2021-04-23 | Discharge: 2021-04-23 | Disposition: A | Discharge status: Home / Self Care | Payer: Medicare HMO | Source: Ambulatory Visit | Attending: Internal Medicine | Admitting: Internal Medicine

## 2021-04-23 ENCOUNTER — Other Ambulatory Visit: Payer: Self-pay

## 2021-04-23 DIAGNOSIS — Z1231 Encounter for screening mammogram for malignant neoplasm of breast: Secondary | ICD-10-CM | POA: Diagnosis present

## 2022-05-01 IMAGING — MG MM DIGITAL SCREENING BILAT W/ TOMO AND CAD
6 of 10 series · 6 of 30 positions shown · non-contrast
Comparison: Previous exam(s).

CLINICAL DATA: Screening.

EXAM:
DIGITAL SCREENING BILATERAL MAMMOGRAM WITH TOMOSYNTHESIS AND CAD
TECHNIQUE: Bilateral screening digital craniocaudal and mediolateral oblique
mammograms were obtained. Bilateral screening digital breast
tomosynthesis was performed. The images were evaluated with
computer-aided detection.

[L MLO synth-2D (1 of 2)]
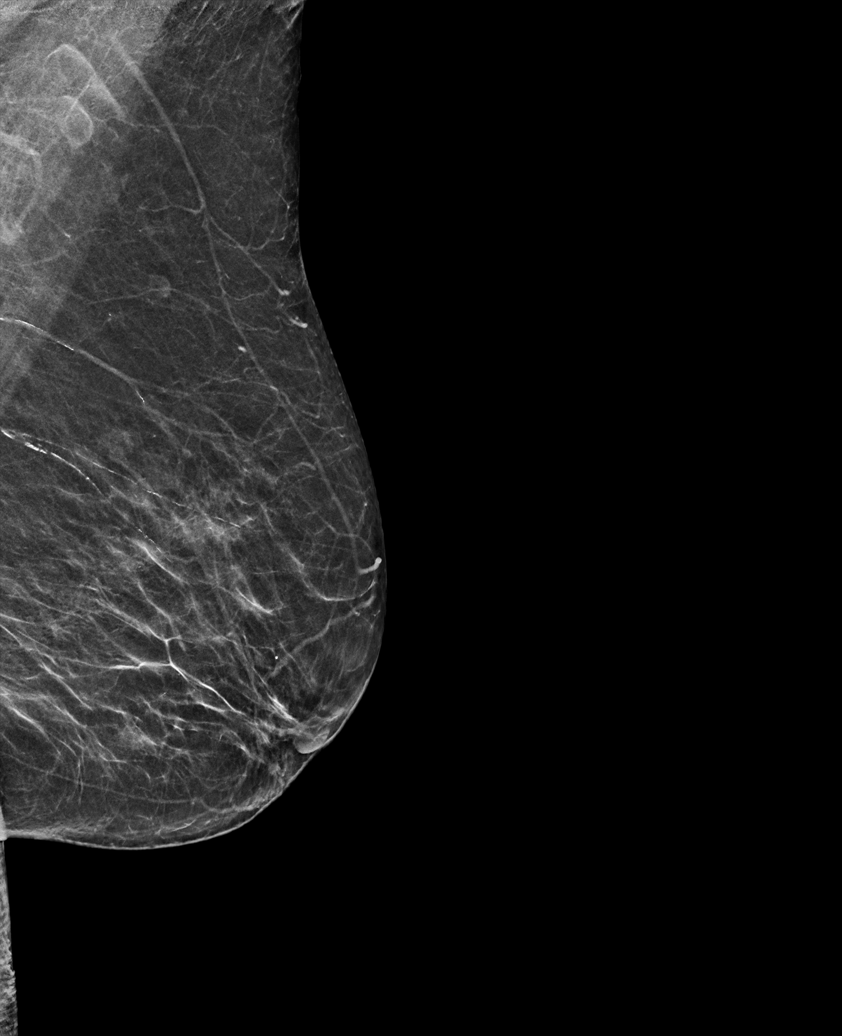

[R MLO synth-2D]
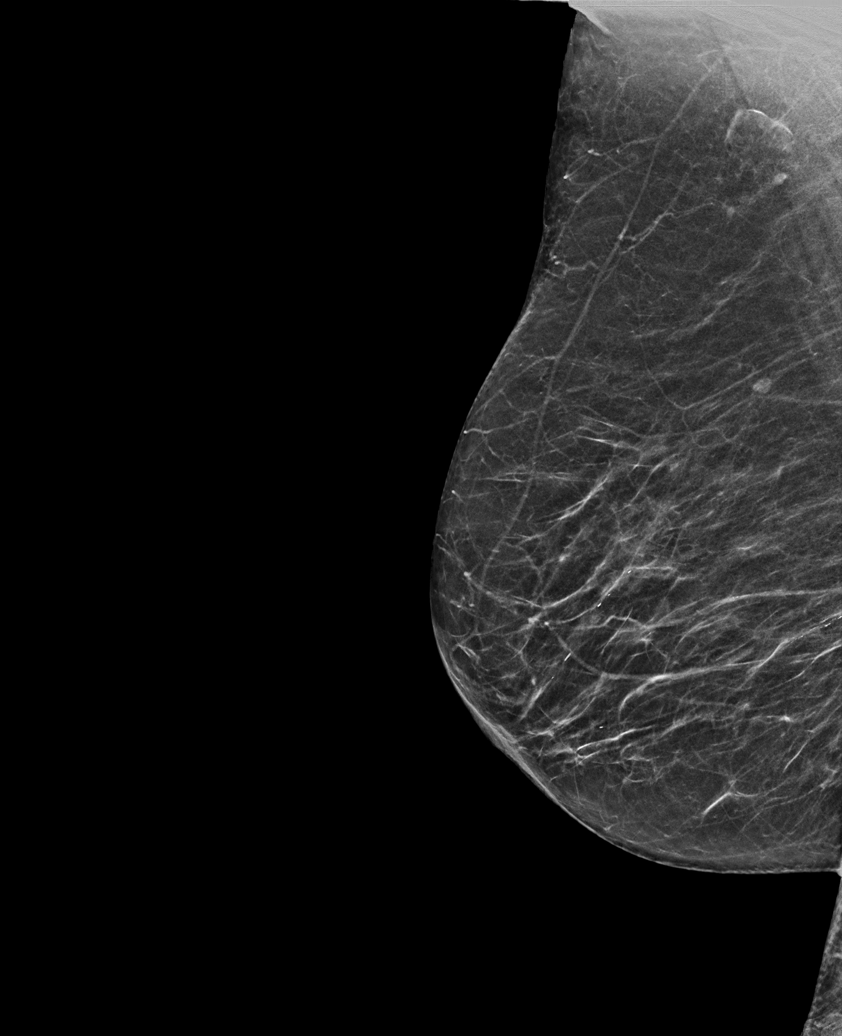

[L CC synth-2D]
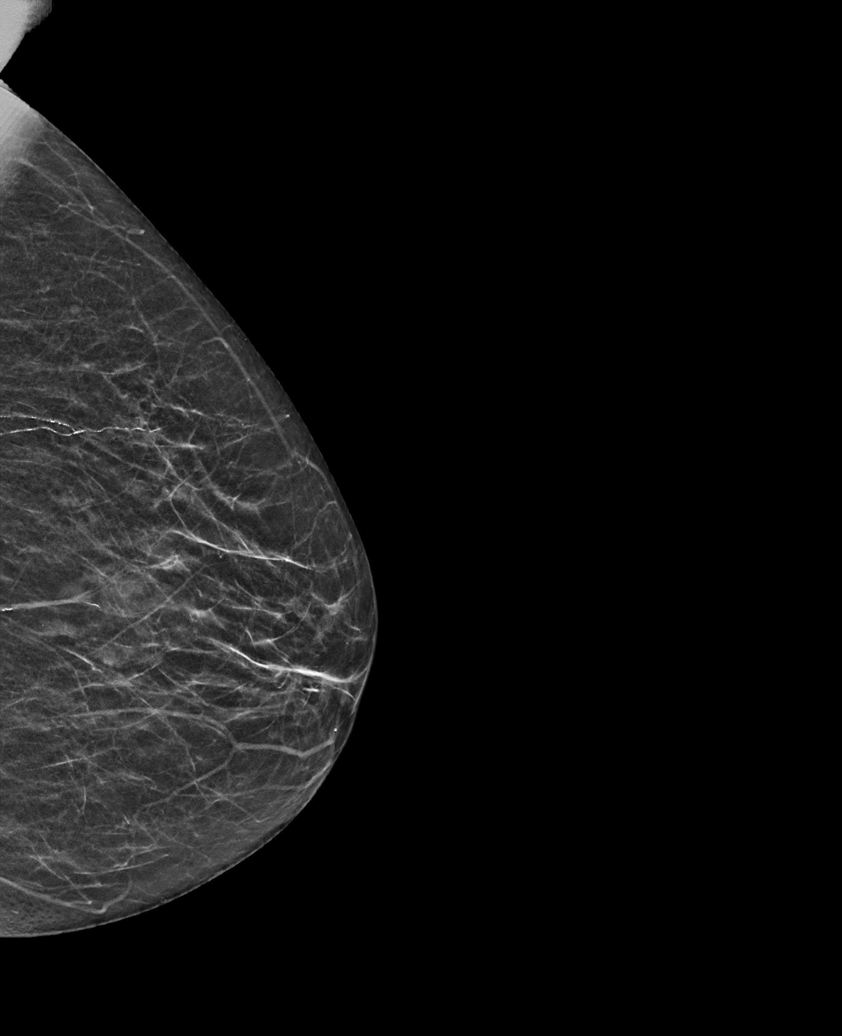

[L MLO synth-2D (2 of 2)]
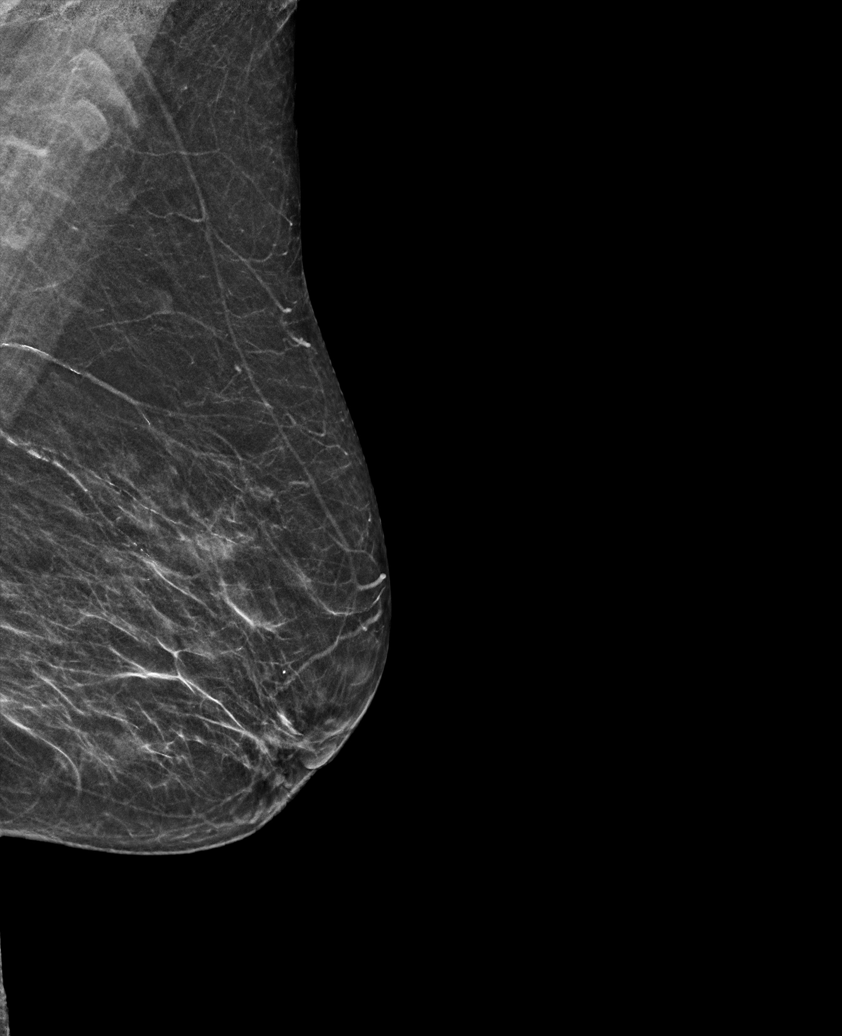

[R CC synth-2D]
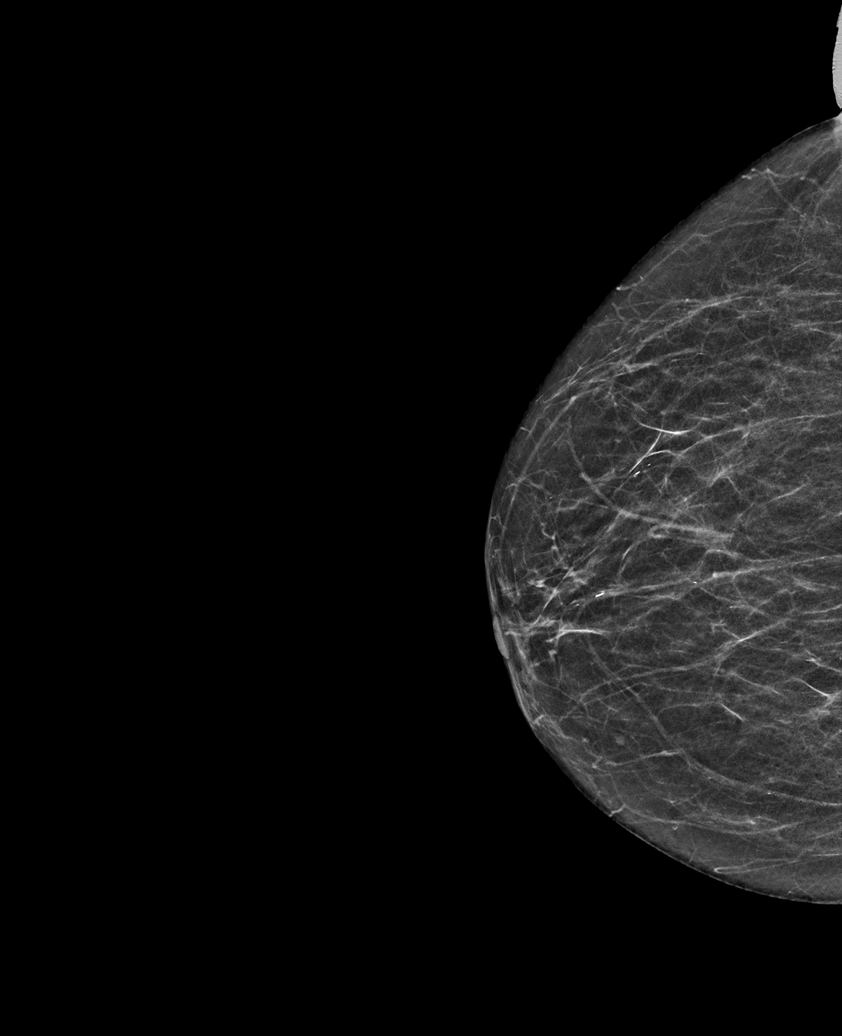

[R CC tomo · tomo slice 21/42.0]
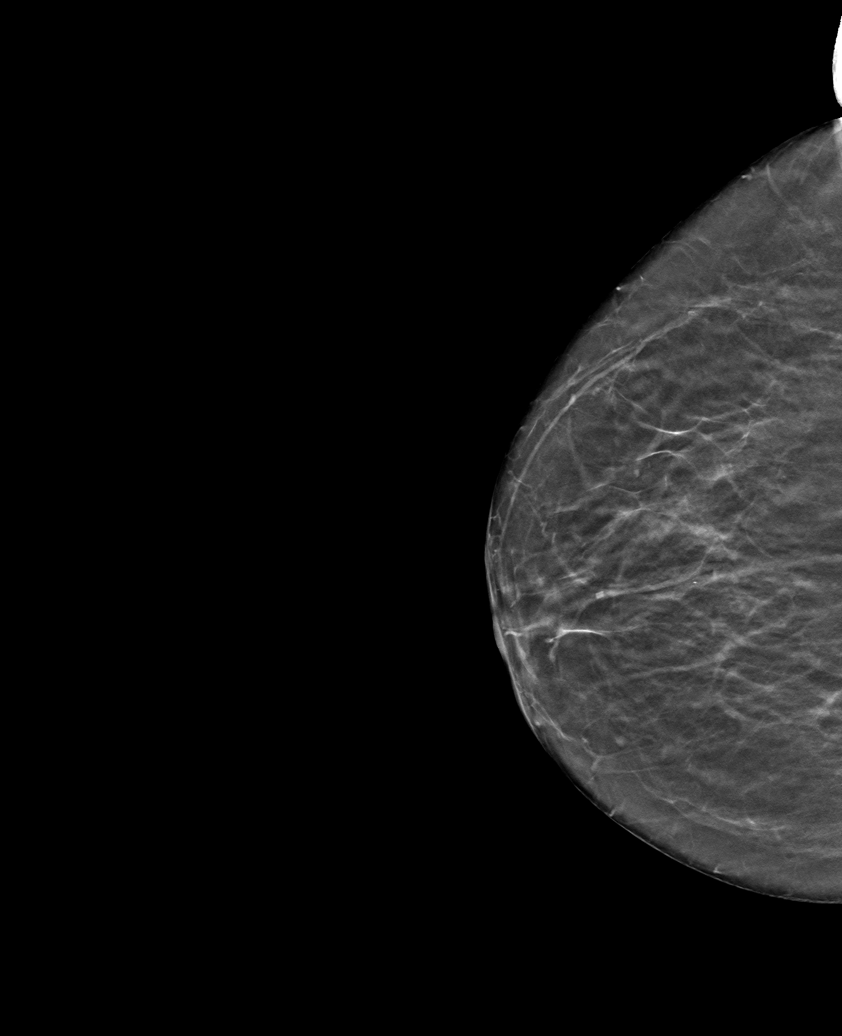

[6 of 30 positions shown; findings below may reference images not displayed]

ACR Breast Density Category b: There are scattered areas of
fibroglandular density.
FINDINGS: There are no findings suspicious for malignancy.
IMPRESSION: No mammographic evidence of malignancy. A result letter of this
screening mammogram will be mailed directly to the patient.

RECOMMENDATION:
Screening mammogram in one year. (Code:51-O-LD2)

BI-RADS CATEGORY  1: Negative.

## 2023-01-06 ENCOUNTER — Encounter: Payer: Self-pay | Admitting: Internal Medicine

## 2023-01-06 DIAGNOSIS — Z1231 Encounter for screening mammogram for malignant neoplasm of breast: Secondary | ICD-10-CM

## 2023-04-29 ENCOUNTER — Other Ambulatory Visit: Payer: Self-pay

## 2023-04-29 ENCOUNTER — Emergency Department: Payer: Medicare Other

## 2023-04-29 ENCOUNTER — Emergency Department
Admission: EM | Admit: 2023-04-29 | Discharge: 2023-04-29 | Disposition: A | Discharge status: Home / Self Care | Payer: Medicare Other | Attending: Emergency Medicine | Admitting: Emergency Medicine

## 2023-04-29 DIAGNOSIS — S0990XA Unspecified injury of head, initial encounter: Secondary | ICD-10-CM | POA: Insufficient documentation

## 2023-04-29 DIAGNOSIS — W01198A Fall on same level from slipping, tripping and stumbling with subsequent striking against other object, initial encounter: Secondary | ICD-10-CM | POA: Insufficient documentation

## 2023-04-29 DIAGNOSIS — W19XXXA Unspecified fall, initial encounter: Secondary | ICD-10-CM

## 2023-04-29 DIAGNOSIS — S79911A Unspecified injury of right hip, initial encounter: Secondary | ICD-10-CM | POA: Diagnosis present

## 2023-04-29 DIAGNOSIS — Z79899 Other long term (current) drug therapy: Secondary | ICD-10-CM | POA: Diagnosis not present

## 2023-04-29 DIAGNOSIS — S7001XA Contusion of right hip, initial encounter: Secondary | ICD-10-CM | POA: Insufficient documentation

## 2023-04-29 DIAGNOSIS — I10 Essential (primary) hypertension: Secondary | ICD-10-CM | POA: Insufficient documentation

## 2023-04-29 LAB — CBC WITH DIFFERENTIAL/PLATELET
Abs Immature Granulocytes: 0.19 10*3/uL — ABNORMAL HIGH (ref 0.00–0.07)
Basophils Absolute: 0 10*3/uL (ref 0.0–0.1)
Basophils Relative: 0 %
Eosinophils Absolute: 0.2 10*3/uL (ref 0.0–0.5)
Eosinophils Relative: 2 %
HCT: 44 % (ref 36.0–46.0)
Hemoglobin: 14.8 g/dL (ref 12.0–15.0)
Immature Granulocytes: 2 %
Lymphocytes Relative: 9 %
Lymphs Abs: 0.9 10*3/uL (ref 0.7–4.0)
MCH: 31.9 pg (ref 26.0–34.0)
MCHC: 33.6 g/dL (ref 30.0–36.0)
MCV: 94.8 fL (ref 80.0–100.0)
Monocytes Absolute: 0.7 10*3/uL (ref 0.1–1.0)
Monocytes Relative: 7 %
Neutro Abs: 8.7 10*3/uL — ABNORMAL HIGH (ref 1.7–7.7)
Neutrophils Relative %: 80 %
Platelets: 222 10*3/uL (ref 150–400)
RBC: 4.64 MIL/uL (ref 3.87–5.11)
RDW: 13.1 % (ref 11.5–15.5)
WBC: 10.7 10*3/uL — ABNORMAL HIGH (ref 4.0–10.5)
nRBC: 0 % (ref 0.0–0.2)

## 2023-04-29 LAB — BASIC METABOLIC PANEL
Anion gap: 11 (ref 5–15)
BUN: 24 mg/dL — ABNORMAL HIGH (ref 8–23)
CO2: 26 mmol/L (ref 22–32)
Calcium: 9.1 mg/dL (ref 8.9–10.3)
Chloride: 102 mmol/L (ref 98–111)
Creatinine, Ser: 0.93 mg/dL (ref 0.44–1.00)
GFR, Estimated: 59 mL/min — ABNORMAL LOW (ref >60)
Glucose, Bld: 142 mg/dL — ABNORMAL HIGH (ref 70–99)
Potassium: 3.1 mmol/L — ABNORMAL LOW (ref 3.5–5.1)
Sodium: 139 mmol/L (ref 135–145)

## 2023-04-29 MED ORDER — POTASSIUM CHLORIDE CRYS ER 20 MEQ PO TBCR
40.0000 meq | EXTENDED_RELEASE_TABLET | Freq: Once | ORAL | Status: AC
  Administered 2023-04-29: 40 meq via ORAL
  Filled 2023-04-29: qty 2

## 2023-04-29 MED ORDER — LIDOCAINE 5 % EX PTCH: 1.0000 | MEDICATED_PATCH | Freq: Two times a day (BID) | CUTANEOUS | 0 refills | Status: AC

## 2023-04-29 MED ORDER — OXYCODONE-ACETAMINOPHEN 5-325 MG PO TABS
1.0000 | ORAL_TABLET | Freq: Once | ORAL | Status: AC
  Administered 2023-04-29: 1 via ORAL
  Filled 2023-04-29: qty 1

## 2023-04-29 NOTE — ED Notes (Signed)
 Patient taken to imaging.

## 2023-04-29 NOTE — ED Notes (Signed)
 Patient's son is at bedside. Purewick was checked. Patient appears more alert and calm.

## 2023-04-29 NOTE — ED Notes (Signed)
 Patient ambulated out to the hallway with a walker and standby assist. Patient was steady and able to transition back to stretcher with minimal assist.

## 2023-04-29 NOTE — ED Provider Notes (Signed)
 [facility] Provider Note    Event Date/Time   First MD Initiated Contact with Patient 04/29/23 0932     (approximate)   History   Chief Complaint Fall   HPI  Tammy Alvarado is a 89 y.o. female with past medical history of hypertension and PAD who presents to the ED following fall.  Patient reports that she was reaching up to get her walker from her bed this morning when she lost her balance and fell.  She states that she hit the back of her head on the doorway, but did not lose consciousness and now denies any headache or neck pain.  She does report then falling onto her right hip and has severe pain in her right hip with any movement.  She was given 100 mcg of IV fentanyl prior to arrival by EMS, has been unable to walk since the fall.  She denies any pain in her upper extremities, chest, or abdomen.  She does not take a blood thinner.     Physical Exam   Triage Vital Signs: ED Triage Vitals [04/29/23 0934]  Encounter Vitals Group     BP      Systolic BP Percentile      Diastolic BP Percentile      Pulse      Resp      Temp      Temp src      SpO2 93 %     Weight      Height      Head Circumference      Peak Flow      Pain Score      Pain Loc      Pain Education      Exclude from Growth Chart     Most recent vital signs: Vitals:   04/29/23 1130 04/29/23 1200  BP: 120/65 125/68  Pulse: 88 91  Resp:    Temp:    SpO2:      Constitutional: Alert and oriented. Eyes: Conjunctivae are normal. Head: Atraumatic. Nose: No congestion/rhinnorhea. Mouth/Throat: Mucous membranes are moist.  Neck: No midline cervical spine tenderness to palpation. Cardiovascular: Normal rate, regular rhythm. Grossly normal heart sounds.  2+ radial and DP pulses bilaterally. Respiratory: Normal respiratory effort.  No retractions. Lungs CTAB.  No chest wall tenderness to palpation. Gastrointestinal: Soft and nontender. No distention. Musculoskeletal: Diffuse  tenderness to palpation of right hip, no tenderness to palpation at left hip, bilateral knees, or bilateral ankles.  No upper extremity bony tenderness to palpation. Neurologic:  Normal speech and language. No gross focal neurologic deficits are appreciated.    ED Results / Procedures / Treatments   Labs (all labs ordered are listed, but only abnormal results are displayed) Labs Reviewed  CBC WITH DIFFERENTIAL/PLATELET - Abnormal; Notable for the following components:      Result Value   WBC 10.7 (*)    Neutro Abs 8.7 (*)    Abs Immature Granulocytes 0.19 (*)    All other components within normal limits  BASIC METABOLIC PANEL - Abnormal; Notable for the following components:   Potassium 3.1 (*)    Glucose, Bld 142 (*)    BUN 24 (*)    GFR, Estimated 59 (*)    All other components within normal limits     EKG  ED ECG REPORT I, Chesley Noon, the attending physician, personally viewed and interpreted this ECG.   Date: 04/29/2023  EKG Time: 10:52  Rate: 89  Rhythm: normal sinus rhythm  Axis: LAD  Intervals:none  ST&T Change: None  RADIOLOGY Right hip x-ray reviewed and interpreted by me with no fracture or dislocation.  PROCEDURES:  Critical Care performed: No  Procedures   MEDICATIONS ORDERED IN ED: Medications  oxyCODONE-acetaminophen (PERCOCET/ROXICET) 5-325 MG per tablet 1 tablet (1 tablet Oral Given 04/29/23 1255)  potassium chloride SA (KLOR-CON M) CR tablet 40 mEq (40 mEq Oral Given 04/29/23 1421)     IMPRESSION / MDM / ASSESSMENT AND PLAN / ED COURSE  I reviewed the triage vital signs and the nursing notes.                              89 y.o. female with past medical history of hypertension and PAD who presents to the ED following fall where she struck her head, now complains of right hip pain.  Patient's presentation is most consistent with acute presentation with potential threat to life or bodily function.  Differential diagnosis includes, but  is not limited to, intracranial injury, cervical spine injury, hip fracture, dislocation, contusion.  Patient nontoxic-appearing and in no acute distress, vital signs are unremarkable.  We will check CT head and cervical spine, also check x-rays of right hip.  She is neurovascularly intact to her bilateral lower extremities, no evidence of injury to her trunk or upper extremities.  She received IV fentanyl with EMS prior to arrival, pain currently well-controlled without movement.  CT head and cervical spine are negative for acute process, right hip x-ray is also unremarkable.  Patient continues to have significant pain in the right hip, however CT imaging shows no evidence of fracture or dislocation.  Pain now improved following dose of Percocet and patient was able to ambulate with a walker, which is her baseline.  Very low suspicion for occult fracture necessitating MRI at this time and patient is appropriate for discharge home with outpatient follow-up.  Patient and sons agree with plan.      FINAL CLINICAL IMPRESSION(S) / ED DIAGNOSES   Final diagnoses:  Fall, initial encounter  Contusion of right hip, initial encounter  Injury of head, initial encounter     Rx / DC Orders   ED Discharge Orders          Ordered    lidocaine (LIDODERM) 5 %  Every 12 hours        04/29/23 1425             Note:  This document was prepared using Dragon voice recognition software and may include unintentional dictation errors.   Chesley Noon, MD 04/29/23 [phone]

## 2023-04-29 NOTE — ED Triage Notes (Signed)
 Per EMS report, patient had a mechanical fall while reaching for her walker. Patient states she hit the back of her head. Patient is unsure if she "blacked out." Patient states she was able to flip her walker back over and get on her stomach and get to the other room where her phone was. Patient states she landed on her hips and her right hip pain prevented her from standing up. Patient c/o pain in both legs and right hip. Patient has a skin tear on right elbow.  Patient was given of Fentanyl and 4mg  Zofran en route.
